# Patient Record
Sex: Male | Born: 1995 | Race: White | Hispanic: No | Marital: Single | Smoking: Never smoker
Health system: Southern US, Community
[De-identification: ages and names within clinical notes are randomized; demographics above are authoritative.]

## PROBLEM LIST (undated history)

## (undated) DIAGNOSIS — I10 Essential (primary) hypertension: Secondary | ICD-10-CM

## (undated) DIAGNOSIS — E119 Type 2 diabetes mellitus without complications: Secondary | ICD-10-CM

## (undated) DIAGNOSIS — E079 Disorder of thyroid, unspecified: Secondary | ICD-10-CM

## (undated) DIAGNOSIS — J45909 Unspecified asthma, uncomplicated: Secondary | ICD-10-CM

## (undated) HISTORY — PX: TONSILLECTOMY: SUR1361

---
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
## 2015-07-17 ENCOUNTER — Encounter: Payer: Self-pay | Admitting: Emergency Medicine

## 2015-07-17 ENCOUNTER — Emergency Department
Admission: EM | Admit: 2015-07-17 | Discharge: 2015-07-17 | Disposition: A | Discharge status: Home / Self Care | Payer: Medicaid Other | Attending: Emergency Medicine | Admitting: Emergency Medicine

## 2015-07-17 ENCOUNTER — Emergency Department: Payer: Medicaid Other

## 2015-07-17 DIAGNOSIS — M79672 Pain in left foot: Secondary | ICD-10-CM | POA: Insufficient documentation

## 2015-07-17 DIAGNOSIS — E119 Type 2 diabetes mellitus without complications: Secondary | ICD-10-CM | POA: Insufficient documentation

## 2015-07-17 DIAGNOSIS — J45909 Unspecified asthma, uncomplicated: Secondary | ICD-10-CM | POA: Insufficient documentation

## 2015-07-17 DIAGNOSIS — I10 Essential (primary) hypertension: Secondary | ICD-10-CM | POA: Diagnosis not present

## 2015-07-17 HISTORY — DX: Unspecified asthma, uncomplicated: J45.909

## 2015-07-17 MED ORDER — IBUPROFEN 800 MG PO TABS: 800.0000 mg | ORAL_TABLET | Freq: Three times a day (TID) | ORAL | Status: DC | PRN

## 2015-07-17 NOTE — ED Notes (Signed)
Pt reports left foot pain since Wednesday night; reports pain shoots up the middle of his foot. Denies recent injury.

## 2015-07-17 NOTE — ED Provider Notes (Signed)
[facility] Emergency Department Provider Note  ____________________________________________  Time seen: Approximately 12:41 PM  I have reviewed the triage vital signs and the nursing notes.   HISTORY  Chief Complaint Foot Pain    HPI Ryan Fritz is a 20 y.o. male presents with complaints of left foot pain 5 days. Denies any trauma increased pain with ambulation and weightbearing. Relieved minimally with rest and elevation. Denies any specific trauma past medical history significant for hypertension and thyroid disease and diabetes without complication. Describes his pain as an 8/10 nonradiating.   Past Medical History  Diagnosis Date  . Hypertension   . Thyroid disease   . Diabetes mellitus without complication (HCC)   . Asthma     There are no active problems to display for this patient.   Past Surgical History  Procedure Laterality Date  . Tonsillectomy      Current Outpatient Rx  Name  Route  Sig  Dispense  Refill  . ibuprofen (ADVIL,MOTRIN) 800 MG tablet   Oral   Take 1 tablet (800 mg total) by mouth every 8 (eight) hours as needed.   30 tablet   0   . levothyroxine (SYNTHROID, LEVOTHROID) 125 MCG tablet   Oral   Take 150 mcg by mouth daily before breakfast.           Allergies Amoxicillin er  No family history on file.  Social History Social History  Substance Use Topics  . Smoking status: Never Smoker   . Smokeless tobacco: Former NeurosurgeonUser  . Alcohol Use: No    Review of Systems Constitutional: No fever/chills Eyes: No visual changes. ENT: No sore throat. Cardiovascular: Denies chest pain. Respiratory: Denies shortness of breath. Gastrointestinal: No abdominal pain.  No nausea, no vomiting.  No diarrhea.  No constipation. Genitourinary: Negative for dysuria. Musculoskeletal: Positive for left foot pain.. Skin: Negative for rash. Neurological: Negative for headaches, focal weakness or numbness.  10-point ROS  otherwise negative.  ____________________________________________   PHYSICAL EXAM:  VITAL SIGNS: ED Triage Vitals  Enc Vitals Group     BP 07/17/15 1225 121/63 mmHg     Pulse Rate 07/17/15 1225 62     Resp 07/17/15 1225 18     Temp 07/17/15 1225 98.1 F (36.7 C)     Temp Source 07/17/15 1225 Oral     SpO2 07/17/15 1225 98 %     Weight 07/17/15 1225 307 lb (139.254 kg)     Height 07/17/15 1225 6\' 3"  (1.905 m)     Head Cir --      Peak Flow --      Pain Score 07/17/15 1232 8     Pain Loc --      Pain Edu? --      Excl. in GC? --     Constitutional: Alert and oriented. Well appearing and in no acute distress.   Cardiovascular: Normal rate, regular rhythm. Grossly normal heart sounds.  Good peripheral circulation. Respiratory: Normal respiratory effort.  No retractions. Lungs CTAB. Musculoskeletal: Left foot with point tenderness as the base of the second and third metacarpal joints. No ecchymosis or bruising noted. Neurologic:  Normal speech and language. No gross focal neurologic deficits are appreciated. No gait instability. Skin:  Skin is warm, dry and intact. No rash noted. Psychiatric: Mood and affect are normal. Speech and behavior are normal.  ____________________________________________   LABS (all labs ordered are listed, but only abnormal results are displayed)  Labs Reviewed - No data  to display ____________________________________________  RADIOLOGY  No acute osseous findings. ____________________________________________   PROCEDURES  Procedure(s) performed: None  Critical Care performed: No  ____________________________________________   INITIAL IMPRESSION / ASSESSMENT AND PLAN / ED COURSE  Pertinent labs & imaging results that were available during my care of the patient were reviewed by me and considered in my medical decision making (see chart for details).  Nonspecific foot pain. Encouraged patient to follow-up with podiatry of continued  pain. Rx given for Motrin 800 mg 3 times a day. Patient voices no other emergency medical complaints at this time. ____________________________________________   FINAL CLINICAL IMPRESSION(S) / ED DIAGNOSES  Final diagnoses:  Foot pain, left     This chart was dictated using voice recognition software/Dragon. Despite best efforts to proofread, errors can occur which can change the meaning. Any change was purely unintentional.   Evangeline Dakin, PA-C 07/17/15 1354  Jeanmarie Plant, MD 07/17/15 [phone]

## 2015-07-17 NOTE — Discharge Instructions (Signed)
Cryotherapy °Cryotherapy means treatment with cold. Ice or gel packs can be used to reduce both pain and swelling. Ice is the most helpful within the first 24 to 48 hours after an injury or flare-up from overusing a muscle or joint. Sprains, strains, spasms, burning pain, shooting pain, and aches can all be eased with ice. Ice can also be used when recovering from surgery. Ice is effective, has very few side effects, and is safe for most people to use. °PRECAUTIONS  °Ice is not a safe treatment option for people with: °· Raynaud phenomenon. This is a condition affecting small blood vessels in the extremities. Exposure to cold may cause your problems to return. °· Cold hypersensitivity. There are many forms of cold hypersensitivity, including: °¨ Cold urticaria. Red, itchy hives appear on the skin when the tissues begin to warm after being iced. °¨ Cold erythema. This is a red, itchy rash caused by exposure to cold. °¨ Cold hemoglobinuria. Red blood cells break down when the tissues begin to warm after being iced. The hemoglobin that carry oxygen are passed into the urine because they cannot combine with blood proteins fast enough. °· Numbness or altered sensitivity in the area being iced. °If you have any of the following conditions, do not use ice until you have discussed cryotherapy with your caregiver: °· Heart conditions, such as arrhythmia, angina, or chronic heart disease. °· High blood pressure. °· Healing wounds or open skin in the area being iced. °· Current infections. °· Rheumatoid arthritis. °· Poor circulation. °· Diabetes. °Ice slows the blood flow in the region it is applied. This is beneficial when trying to stop inflamed tissues from spreading irritating chemicals to surrounding tissues. However, if you expose your skin to cold temperatures for too long or without the proper protection, you can damage your skin or nerves. Watch for signs of skin damage due to cold. °HOME CARE INSTRUCTIONS °Follow  these tips to use ice and cold packs safely. °· Place a dry or damp towel between the ice and skin. A damp towel will cool the skin more quickly, so you may need to shorten the time that the ice is used. °· For a more rapid response, add gentle compression to the ice. °· Ice for no more than 10 to 20 minutes at a time. The bonier the area you are icing, the less time it will take to get the benefits of ice. °· Check your skin after 5 minutes to make sure there are no signs of a poor response to cold or skin damage. °· Rest 20 minutes or more between uses. °· Once your skin is numb, you can end your treatment. You can test numbness by very lightly touching your skin. The touch should be so light that you do not see the skin dimple from the pressure of your fingertip. When using ice, most people will feel these normal sensations in this order: cold, burning, aching, and numbness. °· Do not use ice on someone who cannot communicate their responses to pain, such as small children or people with dementia. °HOW TO MAKE AN ICE PACK °Ice packs are the most common way to use ice therapy. Other methods include ice massage, ice baths, and cryosprays. Muscle creams that cause a cold, tingly feeling do not offer the same benefits that ice offers and should not be used as a substitute unless recommended by your caregiver. °To make an ice pack, do one of the following: °· Place crushed ice or a   bag of frozen vegetables in a sealable plastic bag. Squeeze out the excess air. Place this bag inside another plastic bag. Slide the bag into a pillowcase or place a damp towel between your skin and the bag. °· Mix 3 parts water with 1 part rubbing alcohol. Freeze the mixture in a sealable plastic bag. When you remove the mixture from the freezer, it will be slushy. Squeeze out the excess air. Place this bag inside another plastic bag. Slide the bag into a pillowcase or place a damp towel between your skin and the bag. °SEEK MEDICAL CARE  IF: °· You develop white spots on your skin. This may give the skin a blotchy (mottled) appearance. °· Your skin turns blue or pale. °· Your skin becomes waxy or hard. °· Your swelling gets worse. °MAKE SURE YOU:  °· Understand these instructions. °· Will watch your condition. °· Will get help right away if you are not doing well or get worse. °  °This information is not intended to replace advice given to you by your health care provider. Make sure you discuss any questions you have with your health care provider. °  °Document Released: 10/29/2010 Document Revised: 03/25/2014 Document Reviewed: 10/29/2010 °Elsevier Interactive Patient Education ©2016 [employer] ° °Musculoskeletal Pain °Musculoskeletal pain is muscle and boney aches and pains. These pains can occur in any part of the body. Your caregiver may treat you without knowing the cause of the pain. They may treat you if blood or urine tests, X-rays, and other tests were normal.  °CAUSES °There is often not a definite cause or reason for these pains. These pains may be caused by a type of germ (virus). The discomfort may also come from overuse. Overuse includes working out too hard when your body is not fit. Boney aches also come from weather changes. Bone is sensitive to atmospheric pressure changes. °HOME CARE INSTRUCTIONS  °· Ask when your test results will be ready. Make sure you get your test results. °· Only take over-the-counter or prescription medicines for pain, discomfort, or fever as directed by your caregiver. If you were given medications for your condition, do not drive, operate machinery or power tools, or sign legal documents for 24 hours. Do not drink alcohol. Do not take sleeping pills or other medications that may interfere with treatment. °· Continue all activities unless the activities cause more pain. When the pain lessens, slowly resume normal activities. Gradually increase the intensity and duration of the activities or  exercise. °· During periods of severe pain, bed rest may be helpful. Lay or sit in any position that is comfortable. °· Putting ice on the injured area. °¨ Put ice in a bag. °¨ Place a towel between your skin and the bag. °¨ Leave the ice on for 15 to 20 minutes, 3 to 4 times a day. °· Follow up with your caregiver for continued problems and no reason can be found for the pain. If the pain becomes worse or does not go away, it may be necessary to repeat tests or do additional testing. Your caregiver may need to look further for a possible cause. °SEEK IMMEDIATE MEDICAL CARE IF: °· You have pain that is getting worse and is not relieved by medications. °· You develop chest pain that is associated with shortness or breath, sweating, feeling sick to your stomach (nauseous), or throw up (vomit). °· Your pain becomes localized to the abdomen. °· You develop any new symptoms that seem different or that concern   you. °MAKE SURE YOU:  °· Understand these instructions. °· Will watch your condition. °· Will get help right away if you are not doing well or get worse. °  °This information is not intended to replace advice given to you by your health care provider. Make sure you discuss any questions you have with your health care provider. °  °Document Released: 03/04/2005 Document Revised: 05/27/2011 Document Reviewed: 11/06/2012 °Elsevier Interactive Patient Education ©2016 [employer] ° °

## 2015-11-11 ENCOUNTER — Emergency Department: Payer: Medicaid Other

## 2015-11-11 ENCOUNTER — Encounter: Payer: Self-pay | Admitting: Emergency Medicine

## 2015-11-11 DIAGNOSIS — Z79899 Other long term (current) drug therapy: Secondary | ICD-10-CM | POA: Insufficient documentation

## 2015-11-11 DIAGNOSIS — Y92410 Unspecified street and highway as the place of occurrence of the external cause: Secondary | ICD-10-CM | POA: Diagnosis not present

## 2015-11-11 DIAGNOSIS — J45909 Unspecified asthma, uncomplicated: Secondary | ICD-10-CM | POA: Diagnosis not present

## 2015-11-11 DIAGNOSIS — E119 Type 2 diabetes mellitus without complications: Secondary | ICD-10-CM | POA: Insufficient documentation

## 2015-11-11 DIAGNOSIS — Y999 Unspecified external cause status: Secondary | ICD-10-CM | POA: Diagnosis not present

## 2015-11-11 DIAGNOSIS — I10 Essential (primary) hypertension: Secondary | ICD-10-CM | POA: Insufficient documentation

## 2015-11-11 DIAGNOSIS — Y9389 Activity, other specified: Secondary | ICD-10-CM | POA: Diagnosis not present

## 2015-11-11 DIAGNOSIS — Z791 Long term (current) use of non-steroidal anti-inflammatories (NSAID): Secondary | ICD-10-CM | POA: Diagnosis not present

## 2015-11-11 DIAGNOSIS — Z87891 Personal history of nicotine dependence: Secondary | ICD-10-CM | POA: Insufficient documentation

## 2015-11-11 DIAGNOSIS — S161XXA Strain of muscle, fascia and tendon at neck level, initial encounter: Secondary | ICD-10-CM | POA: Diagnosis not present

## 2015-11-11 DIAGNOSIS — S199XXA Unspecified injury of neck, initial encounter: Secondary | ICD-10-CM | POA: Diagnosis present

## 2015-11-11 DIAGNOSIS — S70212A Abrasion, left hip, initial encounter: Secondary | ICD-10-CM | POA: Insufficient documentation
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
## 2015-11-12 ENCOUNTER — Emergency Department
Admission: EM | Admit: 2015-11-12 | Discharge: 2015-11-12 | Disposition: A | Discharge status: Home / Self Care | Payer: Medicaid Other | Attending: Emergency Medicine | Admitting: Emergency Medicine

## 2015-11-12 DIAGNOSIS — T148XXA Other injury of unspecified body region, initial encounter: Secondary | ICD-10-CM
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
## 2017-11-18 ENCOUNTER — Emergency Department
Admission: EM | Admit: 2017-11-18 | Discharge: 2017-11-18 | Disposition: A | Discharge status: Home / Self Care | Payer: PRIVATE HEALTH INSURANCE | Attending: Emergency Medicine | Admitting: Emergency Medicine

## 2017-11-18 ENCOUNTER — Emergency Department: Payer: PRIVATE HEALTH INSURANCE

## 2017-11-18 ENCOUNTER — Other Ambulatory Visit: Payer: Self-pay

## 2017-11-18 DIAGNOSIS — R51 Headache: Secondary | ICD-10-CM | POA: Diagnosis not present

## 2017-11-18 DIAGNOSIS — M542 Cervicalgia: Secondary | ICD-10-CM | POA: Diagnosis present

## 2017-11-18 DIAGNOSIS — M545 Low back pain: Secondary | ICD-10-CM | POA: Insufficient documentation

## 2017-11-18 MED ORDER — ONDANSETRON 4 MG PO TBDP
4.0000 mg | ORAL_TABLET | Freq: Once | ORAL | Status: AC
  Administered 2017-11-18: 4 mg via ORAL
  Filled 2017-11-18: qty 1

## 2017-11-18 MED ORDER — ONDANSETRON 4 MG PO TBDP: 4.0000 mg | ORAL_TABLET | Freq: Three times a day (TID) | ORAL | 0 refills | Status: DC | PRN

## 2017-11-18 MED ORDER — HYDROCODONE-ACETAMINOPHEN 5-325 MG PO TABS: 1.0000 | ORAL_TABLET | ORAL | 0 refills | Status: DC | PRN

## 2017-11-18 MED ORDER — HYDROCODONE-ACETAMINOPHEN 5-325 MG PO TABS
2.0000 | ORAL_TABLET | Freq: Once | ORAL | Status: AC
  Administered 2017-11-18: 2 via ORAL
  Filled 2017-11-18: qty 2
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
## 2017-11-18 NOTE — ED Notes (Signed)
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
## 2018-09-29 ENCOUNTER — Emergency Department: Payer: Worker's Compensation

## 2018-09-29 ENCOUNTER — Emergency Department
Admission: EM | Admit: 2018-09-29 | Discharge: 2018-09-29 | Disposition: A | Discharge status: Home / Self Care | Payer: Worker's Compensation | Attending: Emergency Medicine | Admitting: Emergency Medicine

## 2018-09-29 ENCOUNTER — Other Ambulatory Visit: Payer: Self-pay

## 2018-09-29 DIAGNOSIS — Z87891 Personal history of nicotine dependence: Secondary | ICD-10-CM | POA: Insufficient documentation

## 2018-09-29 DIAGNOSIS — R51 Headache: Secondary | ICD-10-CM | POA: Insufficient documentation

## 2018-09-29 DIAGNOSIS — J45909 Unspecified asthma, uncomplicated: Secondary | ICD-10-CM | POA: Insufficient documentation

## 2018-09-29 DIAGNOSIS — I10 Essential (primary) hypertension: Secondary | ICD-10-CM | POA: Diagnosis not present

## 2018-09-29 DIAGNOSIS — M542 Cervicalgia: Secondary | ICD-10-CM | POA: Insufficient documentation

## 2018-09-29 DIAGNOSIS — Z79899 Other long term (current) drug therapy: Secondary | ICD-10-CM | POA: Insufficient documentation

## 2018-09-29 DIAGNOSIS — R519 Headache, unspecified: Secondary | ICD-10-CM

## 2018-09-29 MED ORDER — CYCLOBENZAPRINE HCL 5 MG PO TABS: ORAL_TABLET | ORAL | 0 refills | Status: DC

## 2018-09-29 MED ORDER — IBUPROFEN 600 MG PO TABS
600.0000 mg | ORAL_TABLET | Freq: Once | ORAL | Status: AC
  Administered 2018-09-29: 13:00:00 600 mg via ORAL
  Filled 2018-09-29: qty 1

## 2018-09-29 MED ORDER — IBUPROFEN 600 MG PO TABS: 600.0000 mg | ORAL_TABLET | Freq: Four times a day (QID) | ORAL | 0 refills | Status: DC | PRN

## 2018-09-29 MED ORDER — LIDOCAINE 5 % EX PTCH: 1.0000 | MEDICATED_PATCH | CUTANEOUS | 0 refills | Status: AC
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
## 2018-09-29 NOTE — Discharge Instructions (Signed)
Your head CT and neck CT are reassuring.  Please take Flexeril to help relax your muscles and Mobic for pain and inflammation.  Please follow-up with your primary care appointment on Thursday.  Have them fill out your form to return to work at this appointment.
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
## 2021-09-10 ENCOUNTER — Other Ambulatory Visit: Payer: Self-pay

## 2021-09-10 ENCOUNTER — Emergency Department
Admission: EM | Admit: 2021-09-10 | Discharge: 2021-09-10 | Disposition: A | Discharge status: Home / Self Care | Payer: BC Managed Care – PPO | Attending: Emergency Medicine | Admitting: Emergency Medicine

## 2021-09-10 ENCOUNTER — Encounter: Payer: Self-pay | Admitting: Emergency Medicine

## 2021-09-10 ENCOUNTER — Emergency Department: Payer: BC Managed Care – PPO

## 2021-09-10 DIAGNOSIS — R0789 Other chest pain: Secondary | ICD-10-CM | POA: Insufficient documentation

## 2021-09-10 DIAGNOSIS — S61412A Laceration without foreign body of left hand, initial encounter: Secondary | ICD-10-CM | POA: Diagnosis not present

## 2021-09-10 DIAGNOSIS — S61215A Laceration without foreign body of left ring finger without damage to nail, initial encounter: Secondary | ICD-10-CM | POA: Diagnosis not present

## 2021-09-10 DIAGNOSIS — R258 Other abnormal involuntary movements: Secondary | ICD-10-CM | POA: Insufficient documentation

## 2021-09-10 DIAGNOSIS — R519 Headache, unspecified: Secondary | ICD-10-CM | POA: Insufficient documentation

## 2021-09-10 DIAGNOSIS — J45909 Unspecified asthma, uncomplicated: Secondary | ICD-10-CM | POA: Insufficient documentation

## 2021-09-10 DIAGNOSIS — I10 Essential (primary) hypertension: Secondary | ICD-10-CM | POA: Diagnosis not present

## 2021-09-10 DIAGNOSIS — M546 Pain in thoracic spine: Secondary | ICD-10-CM | POA: Insufficient documentation

## 2021-09-10 DIAGNOSIS — Y92410 Unspecified street and highway as the place of occurrence of the external cause: Secondary | ICD-10-CM | POA: Diagnosis not present

## 2021-09-10 DIAGNOSIS — M542 Cervicalgia: Secondary | ICD-10-CM | POA: Insufficient documentation

## 2021-09-10 DIAGNOSIS — S6992XA Unspecified injury of left wrist, hand and finger(s), initial encounter: Secondary | ICD-10-CM | POA: Diagnosis present

## 2021-09-10 LAB — CBC WITH DIFFERENTIAL/PLATELET
Abs Immature Granulocytes: 0.02 10*3/uL (ref 0.00–0.07)
Basophils Absolute: 0.1 10*3/uL (ref 0.0–0.1)
Basophils Relative: 1 %
Eosinophils Absolute: 0.1 10*3/uL (ref 0.0–0.5)
Eosinophils Relative: 2 %
HCT: 44.1 % (ref 39.0–52.0)
Hemoglobin: 14.9 g/dL (ref 13.0–17.0)
Immature Granulocytes: 0 %
Lymphocytes Relative: 35 %
Lymphs Abs: 2.3 10*3/uL (ref 0.7–4.0)
MCH: 31.1 pg (ref 26.0–34.0)
MCHC: 33.8 g/dL (ref 30.0–36.0)
MCV: 92.1 fL (ref 80.0–100.0)
Monocytes Absolute: 0.5 10*3/uL (ref 0.1–1.0)
Monocytes Relative: 8 %
Neutro Abs: 3.6 10*3/uL (ref 1.7–7.7)
Neutrophils Relative %: 54 %
Platelets: 216 10*3/uL (ref 150–400)
RBC: 4.79 MIL/uL (ref 4.22–5.81)
RDW: 11.9 % (ref 11.5–15.5)
WBC: 6.5 10*3/uL (ref 4.0–10.5)
nRBC: 0 % (ref 0.0–0.2)

## 2021-09-10 LAB — LIPASE, BLOOD: Lipase: 24 U/L (ref 11–51)

## 2021-09-10 LAB — COMPREHENSIVE METABOLIC PANEL
ALT: 55 U/L — ABNORMAL HIGH (ref 0–44)
AST: 30 U/L (ref 15–41)
Albumin: 4.6 g/dL (ref 3.5–5.0)
Alkaline Phosphatase: 56 U/L (ref 38–126)
Anion gap: 10 (ref 5–15)
BUN: 17 mg/dL (ref 6–20)
CO2: 25 mmol/L (ref 22–32)
Calcium: 9.1 mg/dL (ref 8.9–10.3)
Chloride: 104 mmol/L (ref 98–111)
Creatinine, Ser: 1 mg/dL (ref 0.61–1.24)
GFR, Estimated: >60 mL/min (ref >60)
Glucose, Bld: 111 mg/dL — ABNORMAL HIGH (ref 70–99)
Potassium: 3.7 mmol/L (ref 3.5–5.1)
Sodium: 139 mmol/L (ref 135–145)
Total Bilirubin: 1 mg/dL (ref 0.3–1.2)
Total Protein: 7.7 g/dL (ref 6.5–8.1)

## 2021-09-10 MED ORDER — METHOCARBAMOL 750 MG PO TABS: 750.0000 mg | ORAL_TABLET | Freq: Three times a day (TID) | ORAL | 0 refills | Status: AC | PRN

## 2021-09-10 MED ORDER — MELOXICAM 15 MG PO TABS: 15.0000 mg | ORAL_TABLET | Freq: Every day | ORAL | 2 refills | Status: AC

## 2021-09-10 MED ORDER — ONDANSETRON HCL 4 MG/2ML IJ SOLN
4.0000 mg | Freq: Once | INTRAMUSCULAR | Status: AC
  Administered 2021-09-10: 4 mg via INTRAVENOUS
  Filled 2021-09-10: qty 2

## 2021-09-10 MED ORDER — MELOXICAM 15 MG PO TABS: 15.0000 mg | ORAL_TABLET | Freq: Every day | ORAL | 2 refills | Status: DC

## 2021-09-10 MED ORDER — MORPHINE SULFATE (PF) 4 MG/ML IV SOLN
4.0000 mg | Freq: Once | INTRAVENOUS | Status: AC
  Administered 2021-09-10: 4 mg via INTRAVENOUS
  Filled 2021-09-10: qty 1

## 2021-09-10 MED ORDER — METHOCARBAMOL 750 MG PO TABS: 750.0000 mg | ORAL_TABLET | Freq: Three times a day (TID) | ORAL | 0 refills | Status: DC | PRN

## 2021-09-10 MED ORDER — IOHEXOL 300 MG/ML  SOLN
100.0000 mL | Freq: Once | INTRAMUSCULAR | Status: AC | PRN
  Administered 2021-09-10: 100 mL via INTRAVENOUS

## 2021-09-10 NOTE — Discharge Instructions (Addendum)
Take meloxicam and Robaxin as directed. 
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
## 2021-09-11 ENCOUNTER — Encounter: Payer: Self-pay | Admitting: Intensive Care

## 2021-09-11 ENCOUNTER — Other Ambulatory Visit: Payer: Self-pay

## 2021-09-11 ENCOUNTER — Emergency Department
Admission: EM | Admit: 2021-09-11 | Discharge: 2021-09-11 | Disposition: A | Discharge status: Home / Self Care | Payer: BC Managed Care – PPO | Attending: Emergency Medicine | Admitting: Emergency Medicine

## 2021-09-11 DIAGNOSIS — S61412D Laceration without foreign body of left hand, subsequent encounter: Secondary | ICD-10-CM | POA: Diagnosis not present

## 2021-09-11 DIAGNOSIS — Z4801 Encounter for change or removal of surgical wound dressing: Secondary | ICD-10-CM | POA: Diagnosis present

## 2021-09-11 DIAGNOSIS — S060X0A Concussion without loss of consciousness, initial encounter: Secondary | ICD-10-CM

## 2021-09-11 DIAGNOSIS — Z5189 Encounter for other specified aftercare: Secondary | ICD-10-CM
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
## 2021-10-10 ENCOUNTER — Other Ambulatory Visit: Payer: Self-pay | Admitting: Physician Assistant

## 2021-10-10 DIAGNOSIS — F0781 Postconcussional syndrome: Secondary | ICD-10-CM

## 2021-10-10 DIAGNOSIS — R413 Other amnesia: Secondary | ICD-10-CM

## 2021-10-10 DIAGNOSIS — R519 Headache, unspecified: Secondary | ICD-10-CM

## 2021-10-23 ENCOUNTER — Ambulatory Visit
Admission: RE | Admit: 2021-10-23 | Discharge: 2021-10-23 | Disposition: A | Discharge status: Home / Self Care | Payer: BC Managed Care – PPO | Source: Ambulatory Visit | Attending: Physician Assistant | Admitting: Physician Assistant

## 2021-10-23 DIAGNOSIS — R519 Headache, unspecified: Secondary | ICD-10-CM

## 2021-10-23 DIAGNOSIS — F0781 Postconcussional syndrome: Secondary | ICD-10-CM

## 2021-10-23 DIAGNOSIS — R413 Other amnesia: Secondary | ICD-10-CM

## 2021-11-06 ENCOUNTER — Other Ambulatory Visit: Payer: Self-pay | Admitting: Physician Assistant

## 2021-11-06 DIAGNOSIS — R200 Anesthesia of skin: Secondary | ICD-10-CM

## 2021-11-06 DIAGNOSIS — M542 Cervicalgia: Secondary | ICD-10-CM

## 2021-11-06 DIAGNOSIS — M545 Low back pain, unspecified: Secondary | ICD-10-CM

## 2021-11-16 ENCOUNTER — Ambulatory Visit
Admission: RE | Admit: 2021-11-16 | Discharge: 2021-11-16 | Disposition: A | Discharge status: Home / Self Care | Payer: BC Managed Care – PPO | Source: Ambulatory Visit | Attending: Physician Assistant | Admitting: Physician Assistant

## 2021-11-16 DIAGNOSIS — G8929 Other chronic pain: Secondary | ICD-10-CM

## 2021-11-16 DIAGNOSIS — R200 Anesthesia of skin: Secondary | ICD-10-CM

## 2021-11-16 DIAGNOSIS — M542 Cervicalgia: Secondary | ICD-10-CM
